# Patient Record
Sex: Male | Born: 1990 | Hispanic: Yes | Marital: Single
Health system: Southern US, Community
[De-identification: ages and names within clinical notes are randomized; demographics above are authoritative.]

---
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
## 2012-04-17 ENCOUNTER — Ambulatory Visit: Payer: Self-pay | Admitting: Emergency Medicine

## 2012-04-17 IMAGING — US ABDOMEN ULTRASOUND
1 series · 14 of 25 positions shown · non-contrast
Comparison: none

REASON FOR EXAM: RUQ abd pain
COMMENTS:

PROCEDURE:     US  - US ABDOMEN GENERAL SURVEY  - April 17, 2012 [DATE]
RESULT:     Comparison: None.
TECHNIQUE: Multiple grayscale and color Doppler images were obtained of the
abdomen.

[Series 1: abdomen ultrasound · 0.42mm/px · 14 of 118 slices shown]
[im 1/118]
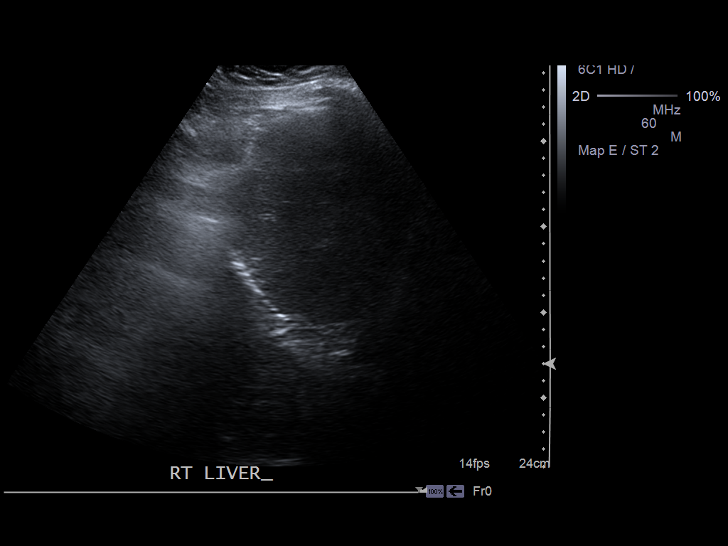
[im 10/118]
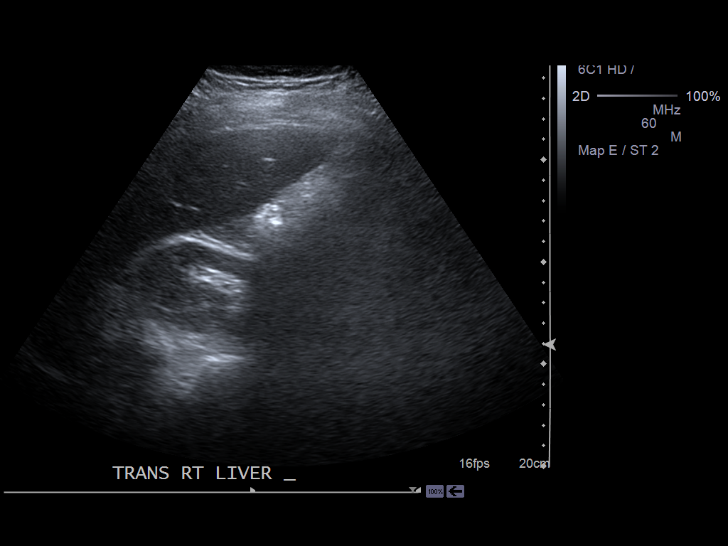
[im 20/118]
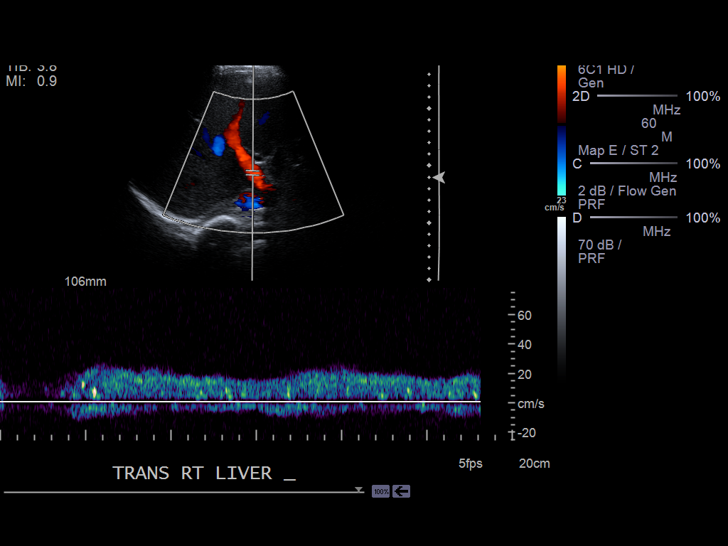
[im 30/118]
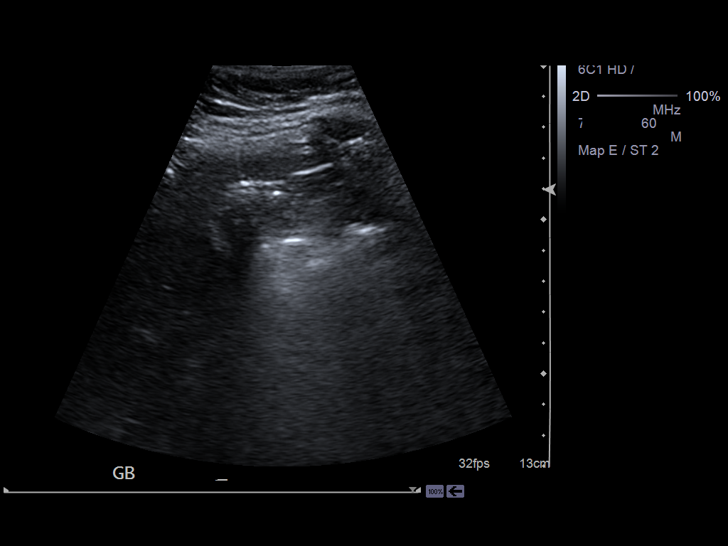
[im 40/118]
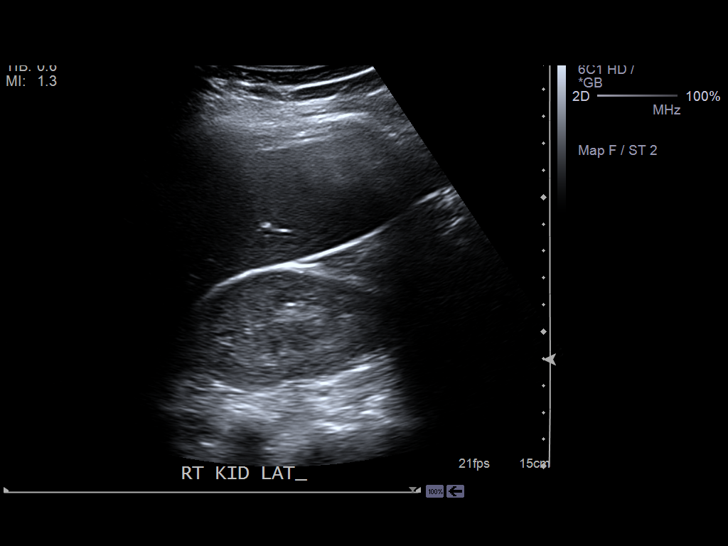
[im 44/118]
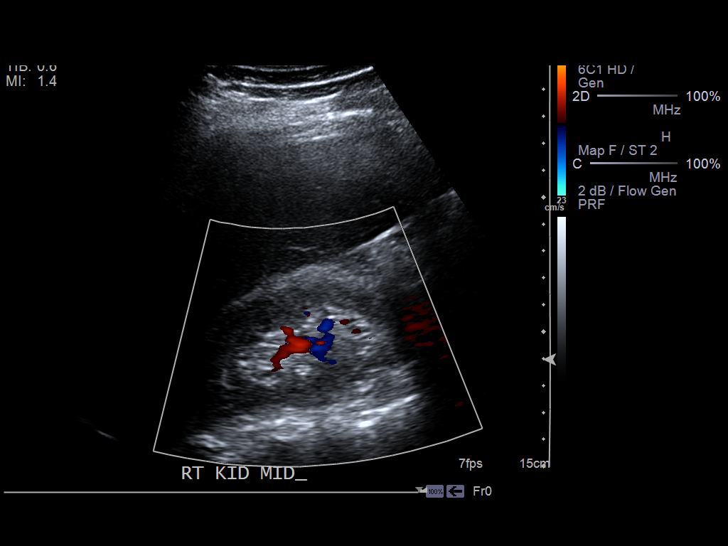
[im 54/118]
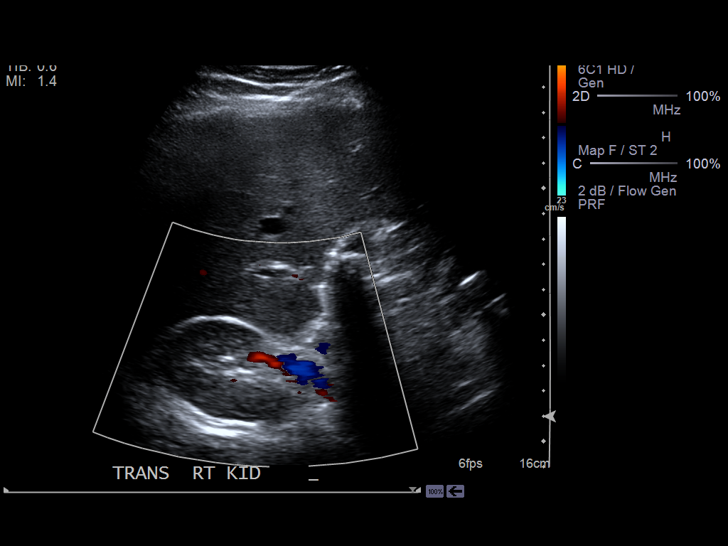
[im 64/118]
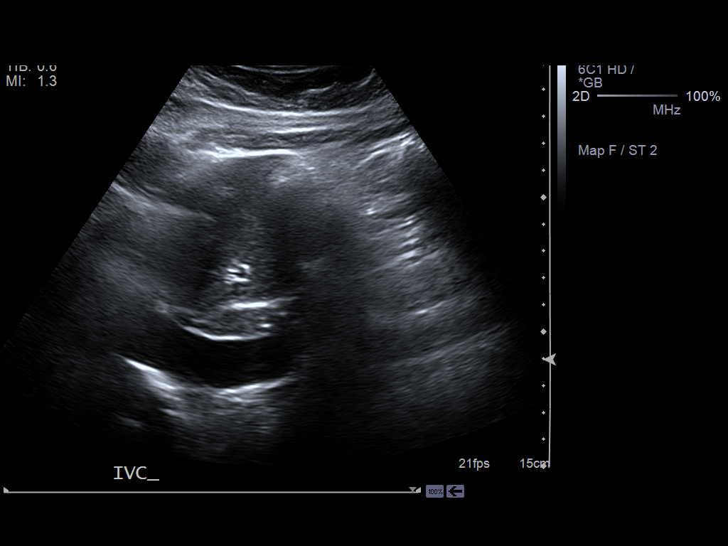
[im 74/118]
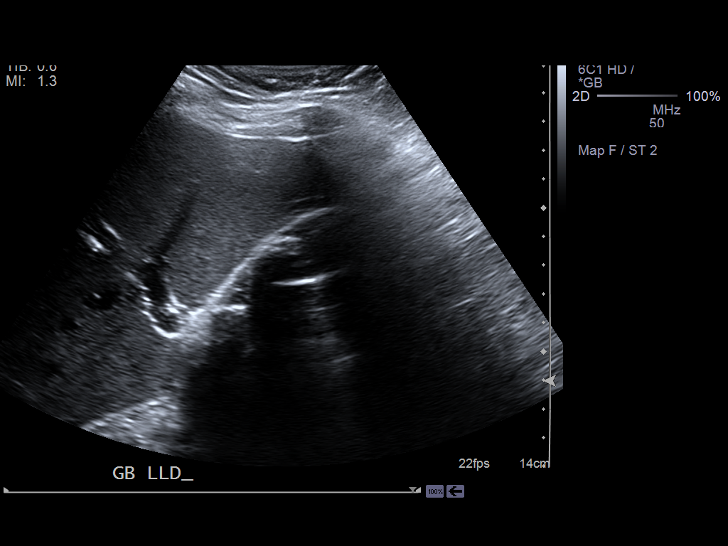
[im 79/118]
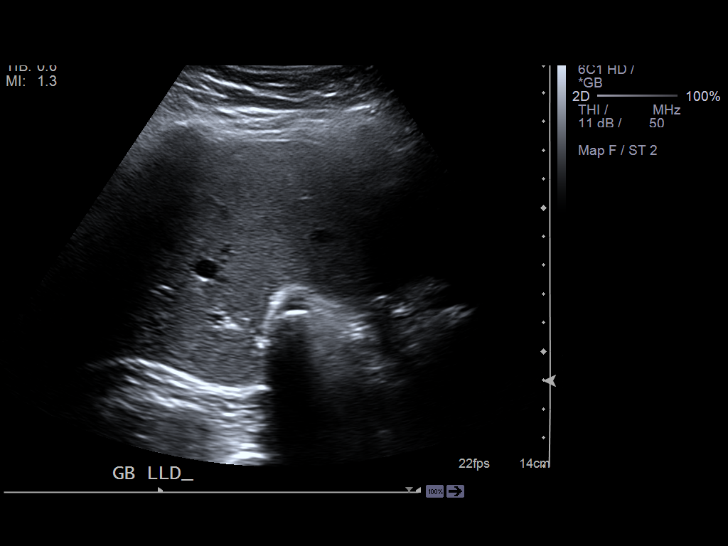
[im 88/118]
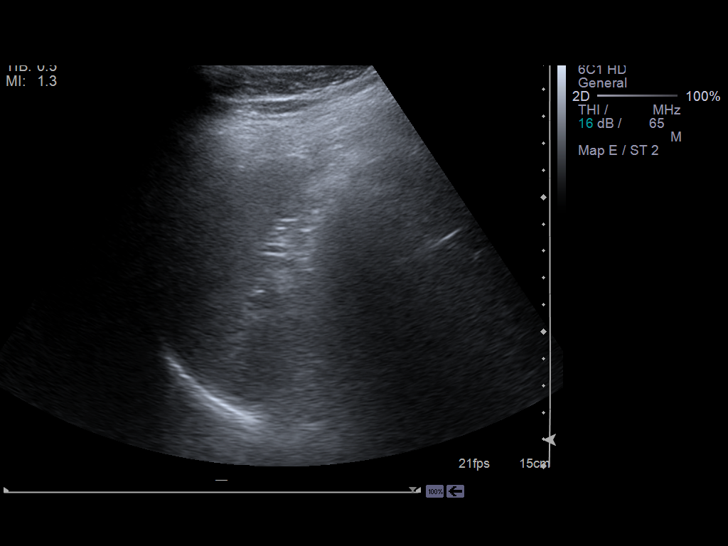
[im 98/118]
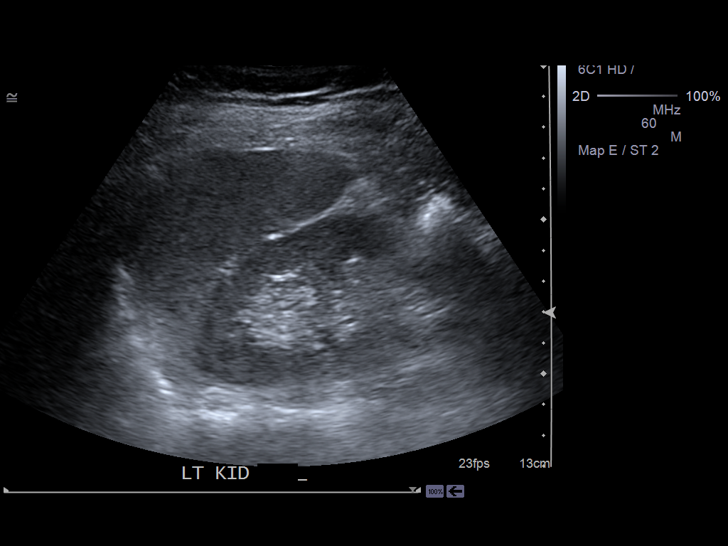
[im 108/118]
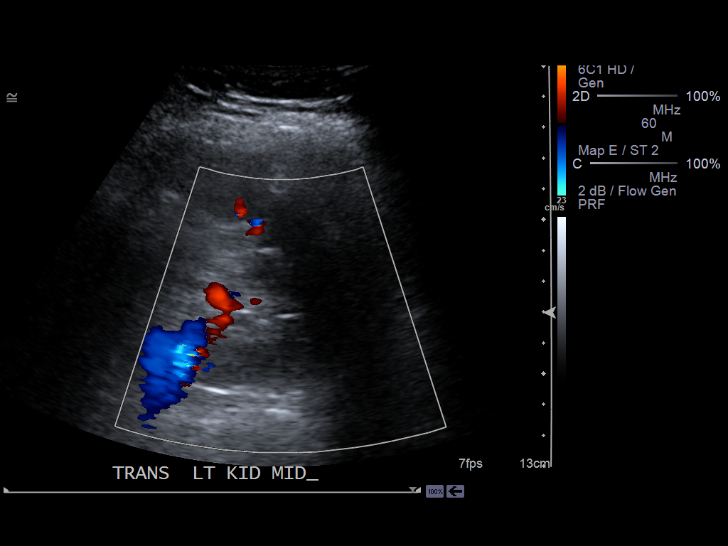
[im 118/118]
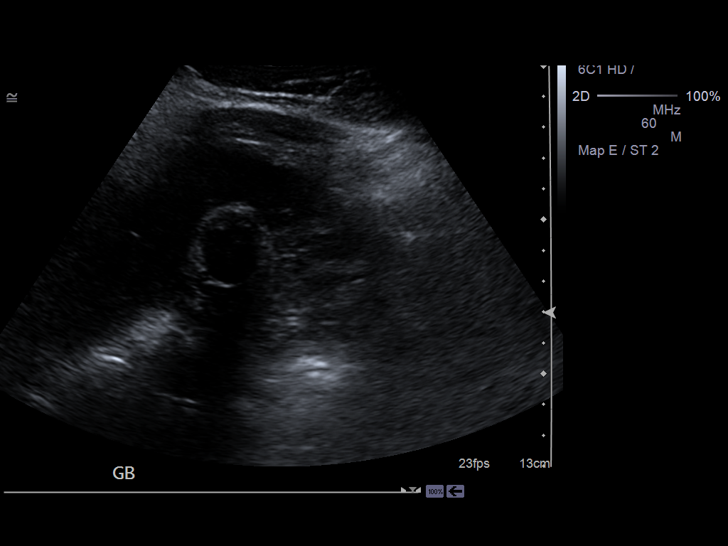

[14 of 25 positions shown; findings below may reference images not displayed]

FINDINGS: The visualized liver and spleen are unremarkable. The main portal vein is
patent. The gallbladder is filled with stones. The sonographic Murphy sign
was negative. The the gallbladder wall measures 3 mm in thickness, which is
at the upper limits of normal. No pericholecystic fluid.

Images of the kidneys showed no hydronephrosis. The pancreas was partially
obscured by overlying bowel gas. The visualized portion the pancreatic body
and wall is unremarkable.
IMPRESSION: Cholelithiasis. The gallbladder wall thickness is at the upper limits of
normal. Otherwise, no sonographic evidence of acute cholecystitis.

[REDACTED]

## 2012-04-22 ENCOUNTER — Ambulatory Visit: Payer: Self-pay | Admitting: Emergency Medicine

## 2012-04-22 LAB — HEPATIC FUNCTION PANEL A (ARMC)
Alkaline Phosphatase: 124 U/L (ref 50–136)
Bilirubin, Direct: <0.05 mg/dL (ref 0.00–0.20)
Bilirubin,Total: 0.5 mg/dL (ref 0.2–1.0)
SGPT (ALT): 59 U/L (ref 12–78)
Total Protein: 8.1 g/dL (ref 6.4–8.2)

## 2012-04-24 ENCOUNTER — Inpatient Hospital Stay: Payer: Self-pay | Admitting: Emergency Medicine

## 2012-04-29 LAB — PATHOLOGY REPORT

## 2014-10-04 NOTE — Op Note (Signed)
PATIENT NAME:  Gerald Singh, Gerald Singh MR#:  [id number] DATE OF BIRTH:  24-Mar-1991  DATE OF PROCEDURE:  04/24/2012  INDICATION: This is a patient who has been having pain in the right upper quadrant of the abdomen, had an ultrasound of the gallbladder done which showed lots of stones in the gallbladder. The patient told me that when he was a baby he had major surgery in the abdomen. The patient also has a scar going across the abdomen, above the umbilicus, and going across the abdomen to the left side completely. I told him before surgery that there is a possibility that we may not be able to do it laparoscopically because of adhesions to enter into the abdomen because of the scar. He is a short, stubby kid and the scar was going almost underneath the right upper quadrant.   DESCRIPTION OF PROCEDURE: First of all, under general anesthesia, the abdomen was then prepped and draped. A small incision was made over the umbilical area. After cutting skin and subcutaneous tissue, the fascia was cut. The peritoneum was tried to open. The patient had a loop of bowel stuck, very adherently. I could not enter from there. I then decided to open him up and did a right subcostal incision. After cutting skin and the subcutaneous tissue, the fascia was reached, fascia was then cut, muscle was then cut, and the abdomen was then entered. After entering the abdomen, the patient's gallbladder was very laterally and it was in a separate kind of capsule encased in the liver. I had to dissect this peritoneum off of the gallbladder to take it off the liver encasement and then brought it straight it up and it was full and distended and was stuck to the gallbladder as well as stuck to the duodenum. After putting the appropriate retractors in,  I emptied the gallbladder and black-colored bile then come out. After that I took out the stones, there were about 30 or 40 small stones, and then two major big, big stones which one of the stones was stuck  in Hartmann's pouch area. I manually removed all the stones and then the problem was that the gallbladder and the liver was stuck together and there was no excess peritoneum to open the space between the gallbladder and the liver. I dissected from the top and slowly and slowly with the Bovie made a space. There was some bleeding from the liver surface, but I pushed a sponge there and kept on dissecting down with the Bovie and pick-up forceps until I go down and the cystic artery was then clipped and cut. The gallbladder was stuck posteriorly also and there was a big posterior artery which was then clipped also. The gallbladder was then released from all the adhesions, which went all the way down to the duodenum, which was a difficult gallbladder because of his size also. So finally we went down to the cystic duct area and then I clamped there and tied it off. Bleeding was stopped in the liver bed and if there was any bleeding or oozing I Bovied it. Irrigation of this area was done which made sure there was no bleeding or bile leakage. I did put a drain in because of acuteness of the gallbladder. The drain was brought through a separate stab wound. After this was done, then I closed the peritoneum with running Vicryl, fascia with interrupted Vicryl sutures, and skin with staples. The drain was then sutured with nylon stitches. The wound from the umbilical area  was also then closed with interrupted 0 Vicryl stitch and skin was closed with staples. The patient tolerated the procedure well and was sent to the recovery room in satisfactory condition.  ____________________________ Alton RevereMasud S. Cecelia ByarsHashmi, MD [email or web address] D: 04/24/2012 15:02:25 ET T: 04/24/2012 15:34:12 ET JOB#: [id number]  cc: Tavarius Grewe S. Cecelia ByarsHashmi, MD, <Dictator> Meryle ReadyMASUD S Dannelle Rhymes MD ELECTRONICALLY SIGNED 04/28/2012 7:32

## 2019-02-19 IMAGING — CR DG C-ARM 1-60 MIN
1 series · 1 of 1 positions shown · non-contrast
Comparison: none

REASON FOR EXAM: Operating Room
COMMENTS:

PROCEDURE:     DXR - DXR C-ARM WITH SPOT IMAGES  - January 01, 2008 [DATE]
RESULT:     There are no prior examinations available for comparison.
The current exam shows post operative staples in the calcaneus and in the
base of the first metatarsal.

[imported/digitized images]
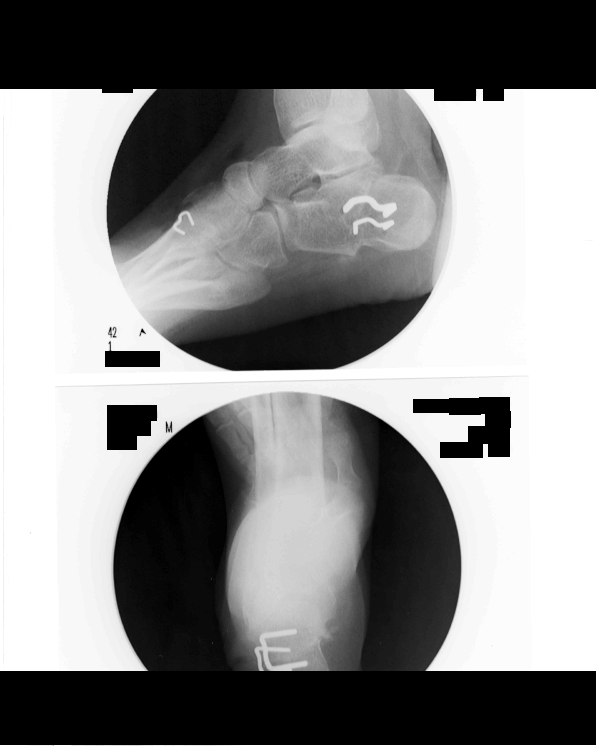

[1 of 1 positions shown; findings below may reference images not displayed]

IMPRESSION: Please see above.
